# Patient Record
Sex: Male | Born: 1960 | Race: Black or African American | Hispanic: No | Marital: Single | Smoking: Current some day smoker
Health system: Southern US, Community
[De-identification: ages and names within clinical notes are randomized; demographics above are authoritative.]

## PROBLEM LIST (undated history)

## (undated) DIAGNOSIS — I10 Essential (primary) hypertension: Secondary | ICD-10-CM

## (undated) HISTORY — DX: Essential (primary) hypertension: I10

---

## 1998-08-29 ENCOUNTER — Emergency Department (HOSPITAL_COMMUNITY): Admission: EM | Admit: 1998-08-29 | Discharge: 1998-08-29 | Payer: Self-pay | Admitting: Emergency Medicine

## 1998-08-29 ENCOUNTER — Encounter: Payer: Self-pay | Admitting: Emergency Medicine

## 1999-10-10 ENCOUNTER — Encounter: Payer: Self-pay | Admitting: Emergency Medicine

## 1999-10-10 ENCOUNTER — Emergency Department (HOSPITAL_COMMUNITY): Admission: EM | Admit: 1999-10-10 | Discharge: 1999-10-10 | Payer: Self-pay | Admitting: Emergency Medicine

## 2002-08-17 ENCOUNTER — Emergency Department (HOSPITAL_COMMUNITY): Admission: EM | Admit: 2002-08-17 | Discharge: 2002-08-17 | Payer: Self-pay | Admitting: Emergency Medicine

## 2002-08-23 ENCOUNTER — Emergency Department (HOSPITAL_COMMUNITY): Admission: EM | Admit: 2002-08-23 | Discharge: 2002-08-23 | Payer: Self-pay | Admitting: Emergency Medicine

## 2003-01-17 ENCOUNTER — Emergency Department (HOSPITAL_COMMUNITY): Admission: AD | Admit: 2003-01-17 | Discharge: 2003-01-17 | Payer: Self-pay | Admitting: Family Medicine

## 2003-12-06 ENCOUNTER — Ambulatory Visit: Payer: Self-pay | Admitting: Family Medicine

## 2003-12-06 ENCOUNTER — Ambulatory Visit (HOSPITAL_COMMUNITY): Admission: RE | Admit: 2003-12-06 | Discharge: 2003-12-06 | Payer: Self-pay | Admitting: Internal Medicine

## 2005-07-10 ENCOUNTER — Emergency Department (HOSPITAL_COMMUNITY): Admission: EM | Admit: 2005-07-10 | Discharge: 2005-07-10 | Payer: Self-pay | Admitting: Emergency Medicine

## 2011-05-06 ENCOUNTER — Encounter (HOSPITAL_COMMUNITY): Payer: Self-pay | Admitting: Emergency Medicine

## 2011-05-06 ENCOUNTER — Emergency Department (HOSPITAL_COMMUNITY)
Admission: EM | Admit: 2011-05-06 | Discharge: 2011-05-06 | Disposition: A | Discharge status: Home / Self Care | Payer: Self-pay | Attending: Emergency Medicine | Admitting: Emergency Medicine

## 2011-05-06 ENCOUNTER — Other Ambulatory Visit: Payer: Self-pay

## 2011-05-06 DIAGNOSIS — K2970 Gastritis, unspecified, without bleeding: Secondary | ICD-10-CM

## 2011-05-06 DIAGNOSIS — K2990 Gastroduodenitis, unspecified, without bleeding: Secondary | ICD-10-CM | POA: Insufficient documentation

## 2011-05-06 DIAGNOSIS — R112 Nausea with vomiting, unspecified: Secondary | ICD-10-CM | POA: Insufficient documentation

## 2011-05-06 DIAGNOSIS — R1013 Epigastric pain: Secondary | ICD-10-CM | POA: Insufficient documentation

## 2011-05-06 LAB — LIPASE, BLOOD: Lipase: 27 U/L (ref 11–59)

## 2011-05-06 LAB — DIFFERENTIAL
Basophils Relative: 1 % (ref 0–1)
Eosinophils Absolute: 0.1 10*3/uL (ref 0.0–0.7)
Neutrophils Relative %: 48 % (ref 43–77)

## 2011-05-06 LAB — COMPREHENSIVE METABOLIC PANEL
ALT: 20 U/L (ref 0–53)
AST: 32 U/L (ref 0–37)
Albumin: 3.9 g/dL (ref 3.5–5.2)
Alkaline Phosphatase: 67 U/L (ref 39–117)
Potassium: 4.9 mEq/L (ref 3.5–5.1)
Sodium: 140 mEq/L (ref 135–145)
Total Protein: 7.9 g/dL (ref 6.0–8.3)

## 2011-05-06 LAB — CBC
MCH: 31.2 pg (ref 26.0–34.0)
MCHC: 36.5 g/dL — ABNORMAL HIGH (ref 30.0–36.0)
Platelets: 288 10*3/uL (ref 150–400)
RBC: 5.52 MIL/uL (ref 4.22–5.81)

## 2011-05-06 LAB — CARDIAC PANEL(CRET KIN+CKTOT+MB+TROPI)
Relative Index: 1 (ref 0.0–2.5)
Troponin I: 0.31 ng/mL (ref <0.30)

## 2011-05-06 MED ORDER — PROMETHAZINE HCL 25 MG PO TABS: 25.0000 mg | ORAL_TABLET | Freq: Four times a day (QID) | ORAL | Status: DC | PRN

## 2011-05-06 MED ORDER — GI COCKTAIL ~~LOC~~
30.0000 mL | Freq: Once | ORAL | Status: AC
  Administered 2011-05-06: 30 mL via ORAL
  Filled 2011-05-06: qty 30

## 2011-05-06 MED ORDER — SODIUM CHLORIDE 0.9 % IV BOLUS (SEPSIS)
500.0000 mL | Freq: Once | INTRAVENOUS | Status: AC
  Administered 2011-05-06: 1000 mL via INTRAVENOUS

## 2011-05-06 MED ORDER — PROMETHAZINE HCL 25 MG PO TABS: 25.0000 mg | ORAL_TABLET | Freq: Four times a day (QID) | ORAL | Status: AC | PRN

## 2011-05-06 MED ORDER — LANSOPRAZOLE 30 MG PO CPDR: 30.0000 mg | DELAYED_RELEASE_CAPSULE | Freq: Every day | ORAL | Status: DC

## 2011-05-06 MED ORDER — PANTOPRAZOLE SODIUM 40 MG IV SOLR
40.0000 mg | Freq: Once | INTRAVENOUS | Status: AC
  Administered 2011-05-06: 40 mg via INTRAVENOUS
  Filled 2011-05-06: qty 40

## 2011-05-06 NOTE — ED Notes (Signed)
Upper epigastric pain x 3-4 months does have nausea and some vomiting. states does drink occ having reg bms denies dysuria states lives at home to take care of his parents father is blind

## 2011-05-06 NOTE — ED Notes (Signed)
Pt reports for the last month every time he eats something he gets sick in the stomach and has been throwing up.  Pt states he is having epigastric pain.

## 2011-05-06 NOTE — ED Notes (Signed)
Went to talk to pt and he was on phone told me to come back

## 2011-05-06 NOTE — Discharge Instructions (Signed)
Gastritis °Gastritis is an inflammation (the body's way of reacting to injury and/or infection) of the stomach. It is often caused by viral or bacterial (germ) infections. It can also be caused by chemicals (including alcohol) and medications. This illness may be associated with generalized malaise (feeling tired, not well), cramps, and fever. The illness may last 2 to 7 days. If symptoms of gastritis continue, gastroscopy (looking into the stomach with a telescope-like instrument), biopsy (taking tissue samples), and/or blood tests may be necessary to determine the cause. Antibiotics will not affect the illness unless there is a bacterial infection present. One common bacterial cause of gastritis is an organism known as H. Pylori. This can be treated with antibiotics. Other forms of gastritis are caused by too much acid in the stomach. They can be treated with medications such as H2 blockers and antacids. Home treatment is usually all that is needed. Young children will quickly become dehydrated (loss of body fluids) if vomiting and diarrhea are both present. Medications may be given to control nausea. Medications are usually not given for diarrhea unless especially bothersome. Some medications slow the removal of the virus from the gastrointestinal tract. This slows down the healing process. °HOME CARE INSTRUCTIONS °Home care instructions for nausea and vomiting: °· For adults: drink small amounts of fluids often. Drink at least 2 quarts a day. Take sips frequently. Do not drink large amounts of fluid at one time. This may worsen the nausea.  °· Only take over-the-counter or prescription medicines for pain, discomfort, or fever as directed by your caregiver.  °· Drink clear liquids only. Those are anything you can see through such as water, broth, or soft drinks.  °· Once you are keeping clear liquids down, you may start full liquids, soups, juices, and ice cream or sherbet. Slowly add bland (plain, not spicy)  foods to your diet.  °Home care instructions for diarrhea: °· Diarrhea can be caused by bacterial infections or a virus. Your condition should improve with time, rest, fluids, and/or anti-diarrheal medication.  °· Until your diarrhea is under control, you should drink clear liquids often in small amounts. Clear liquids include: water, broth, jell-o water and weak tea.  °Avoid: °· Milk.  °· Fruits.  °· Tobacco.  °· Alcohol.  °· Extremely hot or cold fluids.  °· Too much intake of anything at one time.  °When your diarrhea stops you may add the following foods, which help the stool to become more formed: °· Rice.  °· Bananas.  °· Apples without skin.  °· Dry toast.  °Once these foods are tolerated you may add low-fat yogurt and low-fat cottage cheese. They will help to restore the normal bacterial balance in your bowel. °Wash your hands well to avoid spreading bacteria (germ) or virus. °SEEK IMMEDIATE MEDICAL CARE IF:  °· You are unable to keep fluids down.  °· Vomiting or diarrhea become persistent (constant).  °· Abdominal pain develops, increases, or localizes. (Right sided pain can be appendicitis. Left sided pain in adults can be diverticulitis.)  °· You develop a fever (an oral temperature above 102° F (38.9° C)).  °· Diarrhea becomes excessive or contains blood or mucus.  °· You have excessive weakness, dizziness, fainting or extreme thirst.  °· You are not improving or you are getting worse.  °· You have any other questions or concerns.  °Document Released: 03/02/2001 Document Revised: 11/18/2010 Document Reviewed: 03/08/2005 °[employer]® Patient Information ©2012 [employer]. °

## 2011-05-06 NOTE — ED Notes (Signed)
Pt alert and oriented at time of discharge. Denying any pain. NAD. Vitals stable. Review discharge instructions.

## 2011-05-06 NOTE — ED Provider Notes (Signed)
History     CSN: [id number]  Arrival date & time 05/06/11  0810   First MD Initiated Contact with Patient 05/06/11 [phone]      Chief Complaint  Patient presents with  . Abdominal Pain    (Consider location/radiation/quality/duration/timing/severity/associated sxs/prior treatment) Patient is a 51 y.o. male presenting with abdominal pain. The history is provided by the patient.  Abdominal Pain The primary symptoms of the illness include abdominal pain, nausea and vomiting. The primary symptoms of the illness do not include fever, shortness of breath, diarrhea or hematemesis. The current episode started more than 2 days ago.  The abdominal pain began more than 2 days ago. The abdominal pain has been unchanged since its onset. The abdominal pain is located in the epigastric region. The abdominal pain does not radiate. The abdominal pain is exacerbated by eating.  Nausea began more than 1 week ago. The nausea is associated with eating. The nausea is exacerbated by food.  The emesis contains stomach contents.  The illness is associated with eating. The patient has not had a change in bowel habit. Symptoms associated with the illness do not include chills, constipation, hematuria or back pain.   Pt states he has had > 2 months of epigastric pain worse after eating associated with nausea and occasional vomiting. No fever, chill, hematemesis, melena, chest pain, SOB. Pt states he is a regular drinker but denies anything but occasional NSAID use  No past medical history on file.  No past surgical history on file.  No family history on file.  History  Substance Use Topics  . Smoking status: Not on file  . Smokeless tobacco: Not on file  . Alcohol Use: Not on file      Review of Systems  Constitutional: Negative for fever and chills.  HENT: Negative for neck pain.   Respiratory: Negative for shortness of breath.   Cardiovascular: Negative for chest pain.  Gastrointestinal: Positive for  nausea, vomiting and abdominal pain. Negative for diarrhea, constipation, blood in stool, abdominal distention and hematemesis.  Genitourinary: Negative for hematuria and flank pain.  Musculoskeletal: Negative for back pain.  Skin: Negative for pallor and rash.  Neurological: Negative for dizziness, weakness, numbness and headaches.    Allergies  Review of patient's allergies indicates no known allergies.  Home Medications   Current Outpatient Rx  Name Route Sig Dispense Refill  . ASPIRIN EC 325 MG PO TBEC Oral Take 325 mg by mouth daily as needed. For headache      BP 114/71  Pulse 71  Temp(Src) 97 F (36.1 C) (Oral)  SpO2 96%  Physical Exam  Nursing note and vitals reviewed. Constitutional: He is oriented to person, place, and time. He appears well-developed and well-nourished. No distress.  HENT:  Head: Normocephalic and atraumatic.  Mouth/Throat: Oropharynx is clear and moist.  Eyes: EOM are normal. Pupils are equal, round, and reactive to light.  Neck: Normal range of motion. Neck supple.  Cardiovascular: Normal rate and regular rhythm.   Pulmonary/Chest: Effort normal and breath sounds normal. No respiratory distress. He has no wheezes. He has no rales.  Abdominal: Soft. Bowel sounds are normal. There is tenderness (mild tenderness over epigastrum). There is no rebound and no guarding.  Musculoskeletal: Normal range of motion. He exhibits no edema and no tenderness.  Neurological: He is alert and oriented to person, place, and time.  Skin: Skin is warm and dry. No rash noted. No erythema.  Psychiatric: He has a normal mood and affect.  His behavior is normal.    ED Course  Procedures (including critical care time)   Labs Reviewed  CBC  DIFFERENTIAL  COMPREHENSIVE METABOLIC PANEL  LIPASE, BLOOD  CARDIAC PANEL(CRET KIN+CKTOT+MB+TROPI)   No results found.   No diagnosis found.   Date: 05/06/2011  Rate: 65  Rhythm: normal sinus rhythm  QRS Axis: normal   Intervals: PR prolonged  ST/T Wave abnormalities: normal  Conduction Disutrbances:none  Narrative Interpretation:   Old EKG Reviewed: none available    MDM  Use NSAIDS with caution. Decrease EtOh consumption./ Return for concerns        Loren Racer, MD 05/06/11 1234

## 2011-05-06 NOTE — ED Notes (Signed)
Dr Ranae Palms aware of hemolized results of pt trop .31 order for redraw placed

## 2013-11-24 ENCOUNTER — Emergency Department (HOSPITAL_COMMUNITY): Payer: Self-pay

## 2013-11-24 ENCOUNTER — Emergency Department (HOSPITAL_COMMUNITY)
Admission: EM | Admit: 2013-11-24 | Discharge: 2013-11-24 | Disposition: A | Discharge status: Home / Self Care | Payer: Self-pay | Attending: Emergency Medicine | Admitting: Emergency Medicine

## 2013-11-24 ENCOUNTER — Encounter (HOSPITAL_COMMUNITY): Payer: Self-pay | Admitting: Emergency Medicine

## 2013-11-24 DIAGNOSIS — F17200 Nicotine dependence, unspecified, uncomplicated: Secondary | ICD-10-CM | POA: Insufficient documentation

## 2013-11-24 DIAGNOSIS — R079 Chest pain, unspecified: Secondary | ICD-10-CM | POA: Insufficient documentation

## 2013-11-24 DIAGNOSIS — R0789 Other chest pain: Secondary | ICD-10-CM | POA: Insufficient documentation

## 2013-11-24 DIAGNOSIS — R0602 Shortness of breath: Secondary | ICD-10-CM | POA: Insufficient documentation

## 2013-11-24 DIAGNOSIS — M545 Low back pain, unspecified: Secondary | ICD-10-CM | POA: Insufficient documentation

## 2013-11-24 LAB — I-STAT TROPONIN, ED: TROPONIN I, POC: 0 ng/mL (ref 0.00–0.08)

## 2013-11-24 LAB — CBC WITH DIFFERENTIAL/PLATELET
BASOS PCT: 0 % (ref 0–1)
Basophils Absolute: 0 10*3/uL (ref 0.0–0.1)
EOS ABS: 0 10*3/uL (ref 0.0–0.7)
Eosinophils Relative: 0 % (ref 0–5)
HCT: 45.1 % (ref 39.0–52.0)
HEMOGLOBIN: 16.4 g/dL (ref 13.0–17.0)
Lymphocytes Relative: 33 % (ref 12–46)
Lymphs Abs: 2.4 10*3/uL (ref 0.7–4.0)
MCH: 31.7 pg (ref 26.0–34.0)
MCHC: 36.4 g/dL — AB (ref 30.0–36.0)
MCV: 87.2 fL (ref 78.0–100.0)
Monocytes Absolute: 0.5 10*3/uL (ref 0.1–1.0)
Monocytes Relative: 6 % (ref 3–12)
NEUTROS ABS: 4.4 10*3/uL (ref 1.7–7.7)
NEUTROS PCT: 60 % (ref 43–77)
PLATELETS: 290 10*3/uL (ref 150–400)
RBC: 5.17 MIL/uL (ref 4.22–5.81)
RDW: 13.6 % (ref 11.5–15.5)
WBC: 7.4 10*3/uL (ref 4.0–10.5)

## 2013-11-24 LAB — APTT: APTT: 32 s (ref 24–37)

## 2013-11-24 LAB — BASIC METABOLIC PANEL
ANION GAP: 13 (ref 5–15)
BUN: 10 mg/dL (ref 6–23)
CALCIUM: 9 mg/dL (ref 8.4–10.5)
CHLORIDE: 103 meq/L (ref 96–112)
CO2: 25 meq/L (ref 19–32)
CREATININE: 1.01 mg/dL (ref 0.50–1.35)
GFR calc Af Amer: >90 mL/min (ref >90)
GFR calc non Af Amer: 83 mL/min — ABNORMAL LOW (ref >90)
GLUCOSE: 101 mg/dL — AB (ref 70–99)
Potassium: 3.6 mEq/L — ABNORMAL LOW (ref 3.7–5.3)
Sodium: 141 mEq/L (ref 137–147)

## 2013-11-24 LAB — PROTIME-INR
INR: 1.06 (ref 0.00–1.49)
PROTHROMBIN TIME: 13.8 s (ref 11.6–15.2)

## 2013-11-24 MED ORDER — IBUPROFEN 600 MG PO TABS: 600.0000 mg | ORAL_TABLET | Freq: Four times a day (QID) | ORAL | Status: AC | PRN

## 2013-11-24 NOTE — ED Notes (Signed)
Pt just returned from xray 

## 2013-11-24 NOTE — ED Provider Notes (Signed)
CSN: [id number]     Arrival date & time 11/24/13  5621 History   First MD Initiated Contact with Patient 11/24/13 0710     Chief Complaint  Patient presents with  . Chest Pain  . Back Pain     (Consider location/radiation/quality/duration/timing/severity/associated sxs/prior Treatment) HPI CP and low back pain. CP for 3-4 weeks, stabbing, few seconds. Twisting seems to bring on also at rest cones and goes. Occasionally SOB. Can do physical outside labor without CP or SOB.  Low back pain worse with walking, getting out of bed 3-4 years. No GU sx. Occasionally both legs feel numb. No car so walks everywhere.   FAM HX: father living 60 +CAD Mother living CVA at 53 7 sibs. All healthy.   History reviewed. No pertinent past medical history. History reviewed. No pertinent past surgical history. History reviewed. No pertinent family history. History  Substance Use Topics  . Smoking status: Current Every Day Smoker    Types: Cigarettes  . Smokeless tobacco: Not on file  . Alcohol Use: Yes     Comment: occ   EXCEPT AS PER HPI. Review of Systems  Constitutional: Negative for fever and chills.  HENT: Negative for congestion and sore throat.   Respiratory: Negative for cough and shortness of breath.   Cardiovascular: Negative for chest pain and leg swelling.  Gastrointestinal: Negative for vomiting, abdominal pain and diarrhea.  Genitourinary: Negative for dysuria and difficulty urinating.  Musculoskeletal: Negative for arthralgias and myalgias.  Skin: Negative for color change and rash.  Neurological: Negative for dizziness, weakness and headaches.  Psychiatric/Behavioral: Negative for confusion and agitation.      Allergies  Review of patient's allergies indicates no known allergies.  Home Medications   Prior to Admission medications   Not on File   BP 131/87  Pulse 95  Temp(Src) 97.4 F (36.3 C) (Oral)  Resp 18  Ht  (1.93 m)  Wt 190 lb (86.183 kg)  BMI 23.14  kg/m2  SpO2 97% Physical Exam  Constitutional: He is oriented to person, place, and time. He appears well-developed and well-nourished.  HENT:  Head: Normocephalic and atraumatic.  Eyes: EOM are normal. Pupils are equal, round, and reactive to light.  Neck: Neck supple.  Cardiovascular: Normal rate, regular rhythm, normal heart sounds and intact distal pulses.   Pulmonary/Chest: Effort normal and breath sounds normal. He exhibits tenderness.  Reproducible pain sternocostal margins.  Abdominal: Soft. Bowel sounds are normal. He exhibits no distension. There is no tenderness.  Musculoskeletal: Normal range of motion. He exhibits no edema.  Pain to palp central lower back and SI region. Pt identifies SI b/l as usual source of pain. Excellent lower extremity strength.  Neurological: He is alert and oriented to person, place, and time. He has normal strength. Coordination normal. GCS eye subscore is 4. GCS verbal subscore is 5. GCS motor subscore is 6.  Skin: Skin is warm, dry and intact.  Psychiatric: He has a normal mood and affect.    ED Course  Procedures (including critical care time) Labs Review Labs Reviewed - No data to display  Imaging Review No results found.   EKG Interpretation   Date/Time:  Saturday November 24 2013 07:01:17 EDT Ventricular Rate:  84 PR Interval:  196 QRS Duration: 106 QT Interval:  370 QTC Calculation: 437 R Axis:   -57 Text Interpretation:  Normal sinus rhythm Biatrial enlargement Left axis  deviation Pulmonary disease pattern No significant change since last  tracing Confirmed by HARRISON  MD, FORREST [phone]) on 11/24/2013 7:25:36 AM      MDM   Final diagnoses:  Chest pain with low risk for cardiac etiology  Bilateral low back pain without sciatica    This patient presents with chest pain is very atypical for cardiac disease. At this point in time EKG shows no changes the troponin is normal. I believe he is safe for continued outpatient  workup with risk factor modification of quitting smoking which as been discussed with the patient. The patient's back pain appears to be chronic in nature without any associated neurologic compromise. The patient is aware of the need for family physician ongoing medical care in the resources will be provided for the patient.    Arby Barrette, MD 11/24/13 [phone]

## 2013-11-24 NOTE — ED Notes (Signed)
Family at bedside. 

## 2013-11-24 NOTE — ED Notes (Signed)
Spoke with Dr. Donnald Garre. No further orders received for this patient at this time.

## 2013-11-24 NOTE — ED Notes (Signed)
Pt reports mid chest pain that started yesterday, increases with movement and also having lower back pain. No resp distress noted. Skin w/d. ekg done at triage.

## 2013-11-24 NOTE — Discharge Instructions (Signed)
°Emergency Department Resource Guide °1) Find a Doctor and Pay Out of Pocket °Although you won't have to find out who is covered by your insurance plan, it is a good idea to ask around and get recommendations. You will then need to call the office and see if the doctor you have chosen will accept you as a new patient and what types of options they offer for patients who are self-pay. Some doctors offer discounts or will set up payment plans for their patients who do not have insurance, but you will need to ask so you aren't surprised when you get to your appointment. ° °2) Contact Your Local Health Department °Not all health departments have doctors that can see patients for sick visits, but many do, so it is worth a call to see if yours does. If you don't know where your local health department is, you can check in your phone book. The CDC also has a tool to help you locate your state's health department, and many state websites also have listings of all of their local health departments. ° °3) Find a Walk-in Clinic °If your illness is not likely to be very severe or complicated, you may want to try a walk in clinic. These are popping up all over the country in pharmacies, drugstores, and shopping centers. They're usually staffed by nurse practitioners or physician assistants that have been trained to treat common illnesses and complaints. They're usually fairly quick and inexpensive. However, if you have serious medical issues or chronic medical problems, these are probably not your best option. ° °No Primary Care Doctor: °- Call [facility] at  832-8000 - they can help you locate a primary care doctor that  accepts your insurance, provides certain services, etc. °- Physician Referral Service- 1-800-533-3463 ° °Chronic Pain Problems: °Organization         Address  Phone   Notes  °[facility]  (336) 297-2271 Patients need to be referred by their primary care doctor.  ° °Medication  Assistance: °Organization         Address  Phone   Notes  °[place] [facility] [address] °[place], [place] [address] (336) 641-8030 --Must be a resident of [place] °-- Must have NO insurance coverage whatsoever (no Medicaid/ Medicare, etc.) °-- The pt. MUST have a primary care doctor that directs their care regularly and follows them in the community °  °[facility]  (866) 331-1348   °[employer]  (888) 892-1162   ° °Agencies that provide inexpensive medical care: °Organization         Address  Phone   Notes  °[facility]  (336) 832-8035   °[facility]    (336) 832-7272   °[facility] [address] °[place], [place] [address] (336) 832-4777   °[facility] [address], °[place] (336) 271-4999   °[facility]    (336) 373-0678   °[facility]    (336) 272-1050   °[facility] ° [address], [place] Phone:  (336) 832-4444, Fax:  (336) 832-4440 Hours of Operation:  9 am - 6 pm, M-F.  Also accepts Medicaid/Medicare and self-pay.  °[facility] ° [address], [place] Phone: (336) 832-3150, Fax: (336) [phone]. Hours of Operation:  8:30 am - 5:30 pm, M-F.  Also accepts Medicaid and self-pay.  °[facility] [place] [address], [place] Phone: (336) 878-6027   °[facility] [address], [place], [place] (336)723-1848, Ext. 123 Mondays & Thursdays: 7-9 AM.  First 15 patients are seen on a first come, first serve basis. °  ° °Medicaid-accepting [place] Providers: ° °Organization         Address  Phone   Notes  °[facility] [address], [place] (336) 641-2100 Also accepts self-pay patients.  °[facility] [address], [place] ° (336) 856-9996   °[facility] [address], [place]  (336) 288-8857   °[facility] [address] [place] [address], [place] (336) 299-7000   °Veita Bland [address], [place]  ° (336) 373-1557 Only accepts [place] Access Medicaid patients after they have their name applied to their card.  ° °Self-Pay (no insurance) in [place]: ° °Organization         Address  Phone   Notes  °Sickle Cell Patients, [facility] [address], [place] (336) 832-1970   °[facility] [address], [place] (336) 832-4400   °[facility] ° [address], [place] (336) 992-4800   °[facility]/Dr. Osei-Bonsu ° [address] [place] [address], [place] or [address], [place] (336) 841-8500 Phone number for both [place] and [place] locations is the same.  °[facility] [address], [place] (336) 299-0000   °[facility] [address] [place] [address], [place] or [address] (336) 852-7530 °(336) 878-2260   °[facility] [address], [place] (336) 350-1642, phone; (336) 294-5005, fax Sees patients 1st and 3rd Saturday of every month.  Must not qualify for public or private insurance (i.e. Medicaid, Medicare, [place] Health Choice, Veterans' Benefits) • Household income should be no more than 200% of the poverty level •The clinic cannot treat you if you are pregnant or think you are pregnant • Sexually transmitted diseases are not treated at the clinic.  ° ° °Dental Care: °Organization         Address  Phone  Notes  °[place] [facility] [address], [place] (336) 641-6152 Accepts children up to age 21 who are enrolled in Medicaid or [place] Health Choice; pregnant women with a Medicaid card; and children who have applied for Medicaid or [place] Health Choice, but were declined, whose parents can pay a reduced fee at time of service.  °[place]  [facility] [place]  [address], [place] (336) 641-7733 Accepts children up to age 21 who are enrolled in Medicaid or [place] Health Choice; pregnant women with a Medicaid card; and children who have applied for Medicaid or [place] Health Choice, but were declined, whose parents can pay a reduced fee at time of service.  °[facility] ° [address], [place] (336) 641-4533 Patients are seen by appointment only. Walk-ins are not accepted. [facility] will see patients 18 years of age and older. °Monday - Tuesday (8am-5pm) °Most Wednesdays (8:30-5pm) °$30 per visit, cash only  °[facility] ° [address], [place] (336) 641-4533 Patients are seen by appointment only. Walk-ins are not accepted. [facility] will see patients 18 years of age and older. °One   Wednesday Evening (Monthly: Volunteer Based).  $30 per visit, cash only  °[facility]  (919) 537-3737 for adults; Children under age 4, call Graduate Pediatric Dentistry at ([phone]) [phone]. Children aged 4-14, please call ([phone]) [phone] to request a pediatric application. ° Dental services are provided in all areas of dental care including fillings, crowns and bridges, complete and partial dentures, implants, gum treatment, root canals, and extractions. Preventive care is also provided. Treatment is provided to both adults and children. °Patients are selected via a lottery and there is often a waiting list. °  °[facility] [address], °[place] ° (336) 763-8833 [email or web address] °  °[facility] [address], [place], [place] (336)723-1848, Ext. 123 Second and Fourth Thursday of each month, opens at 6:30 AM; Clinic ends at 9 AM.  Patients are seen on a first-come first-served basis, and a limited number are seen during each clinic.  ° °[facility] ° [address], [place], [place] (336) 723-7904    Eligibility Requirements °You must have lived in [place], [place], or [place] counties for at least the last three months. °  You cannot be eligible for state or federal sponsored healthcare insurance, including Veterans Administration, Medicaid, or Medicare. °  You generally cannot be eligible for healthcare insurance through your employer.  °  How to apply: °Eligibility screenings are held every Tuesday and Wednesday afternoon from 1:00 pm until 4:00 pm. You do not need an appointment for the interview!  °[facility] [address], [place], [place] 336-631-2330   °[facility]  336-342-8273   °[facility]  336-703-3100   °[facility]  336-570-6415   ° °Behavioral Health Resources in the Community: °Intensive Outpatient Programs °Organization         Address  Phone  Notes  °[facility] [address], [place], [place] 336-878-6098   °[facility] [address], [place], [place] 336-832-9800   °[facility] [address], [place], [place] ° 336-882-2125   °[facility] [address],  °[place], [place] 1-800-853-5163 or 336-641-4981   °Substance Abuse Resources °Organization         Address  Phone  Notes  °[facility]  336-882-2125   °[facility]  336-784-9470   °[facility]  336-285-9073   °[facility]  336-845-3988   °Residential & Outpatient Substance Abuse Program  1-800-659-3381   °Psychological Services °Organization         Address  Phone  Notes  °[facility]  336- 832-9600   °[facility]  336- 378-7881   °[facility] [address], [place] 1-800-853-5163 or 336-641-4981   ° °Mobile Crisis Teams °Organization         Address  Phone  Notes  °[facility], Mobile Crisis Care Unit  1-877-626-1772   °Assertive °[facility] ° [address]  [place], [place] 336-834-9664   °Sharon DeEsch [address] °[place] 336-554-5454   ° °Self-Help/Support Groups °Organization         Address  Phone             Notes  °[facility]  - variety of support groups  336- 373-1402 Call for more information  °Narcotics Anonymous (NA), [facility] [address], °[place]  2 meetings at this location  ° °  Residential Treatment Programs Organization         Address  Phone  Notes  [facility] [address]    [place]  1-478-295-6213   [facility]  [address] [place] [address], [place], [place] 469-629-5284   [facility] [address] [place], [place] 132-440-1027 Admissions: 8am-3pm M-F  [facility] [address],    [place], [place] 253-664-4034   [facility] [address] [place], [place], [place] 742-595-6387   [facility] [address],  [place], [place] 564-332-9518   [facility] Intensive Outpatient [address], [place], [place] 841-660-6301   [facility] ([facility] [address] [place]  [place], [place] 6-010-932-3557 or (724) 306-6363   [facility] ([facility] [address] [place], [place] 623-762-8315 Accepts Medicaid  [facility] [address],  [place] 1-761-607-3710 Substance Abuse/Addiction Treatment   [facility]         Address  Phone  Notes  [facility]  (949) 216-8729   Angie Fava, PhD [address] [place], [place]   (717)265-2478 or 506-118-9413   [facility]   [address] [place], [place] [phone]   [facility] [address] [place], [place] [phone] Insurance/Medicaid/sponsorship through [facility] [address]                                    [place], [place] [phone] Therapy/tele-psych/case    [facility] [address], [place] 250-627-2650    Dr. Lolly Mustache  [phone]   [facility]  [employer] [facility] Dept. 1) [address], [place] 2) [address] [place] 3)  [address], [place] [phone]  339-517-5356   [facility] Child Abuse Hotline 205-332-7118 or (438)126-0054 (After Hours)     Chest Pain (Nonspecific) It is often hard to give a specific diagnosis for the cause of chest pain. There is always a chance that your pain could be related to something serious, such as a heart attack or a blood clot in the lungs. You need to follow up with your health care provider for further evaluation. CAUSES   Heartburn.  Pneumonia or bronchitis.  Anxiety or stress.  Inflammation around your heart (pericarditis) or lung (pleuritis or pleurisy).  A blood clot in the lung.  A collapsed lung (pneumothorax). It can develop suddenly on its own (spontaneous pneumothorax) or from trauma to the chest.  Shingles infection (herpes zoster virus). The chest wall is composed of bones, muscles, and cartilage. Any of these can be the source of the pain.  The bones can be bruised by injury.  The muscles or cartilage can be strained by coughing or overwork.  The cartilage can be affected by inflammation and become sore (costochondritis). DIAGNOSIS  Lab tests or other studies may be needed to find the cause of your pain. Your health care provider may have you take a test called an ambulatory electrocardiogram (ECG). An ECG records your heartbeat patterns over a 24-hour period. You may also have other tests, such as:  Transthoracic echocardiogram (TTE). During echocardiography, sound waves are used to evaluate how blood flows through your heart.  Transesophageal echocardiogram (TEE).  Cardiac monitoring. This allows your health care provider to monitor your heart rate and rhythm in real  time.  Holter monitor. This is a portable device that records your heartbeat and can help diagnose heart arrhythmias. It allows your health care provider to track your heart activity for several days, if needed.  Stress tests by exercise or by giving medicine that makes the heart beat faster. TREATMENT   Treatment depends on what may be causing your chest pain. Treatment may include:  Acid blockers for heartburn.  Anti-inflammatory medicine.  Pain medicine for inflammatory conditions.  Antibiotics if an infection is present.  You may be advised to change lifestyle habits. This includes stopping smoking and avoiding alcohol, caffeine, and chocolate.  You may be advised to keep your head raised (elevated) when sleeping. This reduces the chance of acid going backward from your stomach into your esophagus. Most of the time, nonspecific chest pain will improve within 2-3 days with rest and mild pain medicine.  HOME CARE INSTRUCTIONS   If antibiotics were prescribed, take them as directed. Finish them even if you start to feel better.  For the next few days, avoid physical activities that bring on chest pain. Continue physical activities as directed.  Do not use any tobacco products, including cigarettes, chewing tobacco, or electronic cigarettes.  Avoid drinking alcohol.  Only take medicine as directed by your health care provider.  Follow your health care provider's suggestions for further testing if your chest pain does not go away.  Keep any follow-up appointments you made. If you do not go to an appointment, you could develop lasting (chronic) problems with pain. If there is any problem keeping an appointment, call to reschedule. SEEK MEDICAL CARE IF:   Your chest pain does not go away, even after treatment.  You have a rash with blisters on your chest.  You have a fever. SEEK IMMEDIATE MEDICAL CARE IF:   You have increased chest pain or pain that spreads to your arm,  neck, jaw, back, or abdomen.  You have shortness of breath.  You have an increasing cough, or you cough up blood.  You have severe back or abdominal pain.  You feel nauseous or vomit.  You have severe weakness.  You faint.  You have chills. This is an emergency. Do not wait to see if the pain will go away. Get medical help at once. Call your local emergency services (911 in [place] Do not drive yourself to the hospital. MAKE SURE YOU:   Understand these instructions.  Will watch your condition.  Will get help right away if you are not doing well or get worse. Document Released: 12/16/2004 Document Revised: 03/13/2013 Document Reviewed: 10/12/2007 [facility] Patient Information 2015 [place], [place]. This information is not intended to replace advice given to you by your health care provider. Make sure you discuss any questions you have with your health care provider. Back Pain, Adult Low back pain is very common. About 1 in 5 people have back pain.The cause of low back pain is rarely dangerous. The pain often gets better over time.About half of people with a sudden onset of back pain feel better in just 2 weeks. About 8 in 10 people feel better by 6 weeks.  CAUSES Some common causes of back pain include:  Strain of the muscles or ligaments supporting the spine.  Wear and tear (degeneration) of the spinal discs.  Arthritis.  Direct injury to the back. DIAGNOSIS Most of the  time, the direct cause of low back pain is not known.However, back pain can be treated effectively even when the exact cause of the pain is unknown.Answering your caregiver's questions about your overall health and symptoms is one of the most accurate ways to make sure the cause of your pain is not dangerous. If your caregiver needs more information, he or she may order lab work or imaging tests (X-rays or MRIs).However, even if imaging tests show changes in your back, this usually does not require  surgery. HOME CARE INSTRUCTIONS For many people, back pain returns.Since low back pain is rarely dangerous, it is often a condition that people can learn to [facility] their own.   Remain active. It is stressful on the back to sit or stand in one place. Do not sit, drive, or stand in one place for more than 30 minutes at a time. Take short walks on level surfaces as soon as pain allows.Try to increase the length of time you walk each day.  Do not stay in bed.Resting more than 1 or 2 days can delay your recovery.  Do not avoid exercise or work.Your body is made to move.It is not dangerous to be active, even though your back may hurt.Your back will likely heal faster if you return to being active before your pain is gone.  Pay attention to your body when you bend and lift. Many people have less discomfortwhen lifting if they bend their knees, keep the load close to their bodies,and avoid twisting. Often, the most comfortable positions are those that put less stress on your recovering back.  Find a comfortable position to sleep. Use a firm mattress and lie on your side with your knees slightly bent. If you lie on your back, put a pillow under your knees.  Only take over-the-counter or prescription medicines as directed by your caregiver. Over-the-counter medicines to reduce pain and inflammation are often the most helpful.Your caregiver may prescribe muscle relaxant drugs.These medicines help dull your pain so you can more quickly return to your normal activities and healthy exercise.  Put ice on the injured area.  Put ice in a plastic bag.  Place a towel between your skin and the bag.  Leave the ice on for 15-20 minutes, 03-04 times a day for the first 2 to 3 days. After that, ice and heat may be alternated to reduce pain and spasms.  Ask your caregiver about trying back exercises and gentle massage. This may be of some benefit.  Avoid feeling anxious or stressed.Stress increases  muscle tension and can worsen back pain.It is important to recognize when you are anxious or stressed and learn ways to manage it.Exercise is a great option. SEEK MEDICAL CARE IF:  You have pain that is not relieved with rest or medicine.  You have pain that does not improve in 1 week.  You have new symptoms.  You are generally not feeling well. SEEK IMMEDIATE MEDICAL CARE IF:   You have pain that radiates from your back into your legs.  You develop new bowel or bladder control problems.  You have unusual weakness or numbness in your arms or legs.  You develop nausea or vomiting.  You develop abdominal pain.  You feel faint. Document Released: 03/08/2005 Document Revised: 09/07/2011 Document Reviewed: 07/10/2013 [facility] Patient Information 2015 [place], [place]. This information is not intended to replace advice given to you by your health care provider. Make sure you discuss any questions you have with your health care provider.

## 2020-07-27 ENCOUNTER — Other Ambulatory Visit: Payer: Self-pay

## 2020-07-27 ENCOUNTER — Encounter (HOSPITAL_COMMUNITY): Payer: Self-pay | Admitting: Emergency Medicine

## 2020-07-27 ENCOUNTER — Emergency Department (HOSPITAL_COMMUNITY)
Admission: EM | Admit: 2020-07-27 | Discharge: 2020-07-28 | Disposition: A | Discharge status: Home / Self Care | Payer: Self-pay | Attending: Emergency Medicine | Admitting: Emergency Medicine

## 2020-07-27 DIAGNOSIS — I10 Essential (primary) hypertension: Secondary | ICD-10-CM | POA: Insufficient documentation

## 2020-07-27 DIAGNOSIS — F17210 Nicotine dependence, cigarettes, uncomplicated: Secondary | ICD-10-CM | POA: Insufficient documentation

## 2020-07-27 LAB — CBC WITH DIFFERENTIAL/PLATELET
Abs Immature Granulocytes: 0.02 10*3/uL (ref 0.00–0.07)
Basophils Absolute: 0.1 10*3/uL (ref 0.0–0.1)
Basophils Relative: 1 %
Eosinophils Absolute: 0.1 10*3/uL (ref 0.0–0.5)
Eosinophils Relative: 2 %
HCT: 49.5 % (ref 39.0–52.0)
Hemoglobin: 17.4 g/dL — ABNORMAL HIGH (ref 13.0–17.0)
Immature Granulocytes: 0 %
Lymphocytes Relative: 51 %
Lymphs Abs: 3.7 10*3/uL (ref 0.7–4.0)
MCH: 30.5 pg (ref 26.0–34.0)
MCHC: 35.2 g/dL (ref 30.0–36.0)
MCV: 86.7 fL (ref 80.0–100.0)
Monocytes Absolute: 0.7 10*3/uL (ref 0.1–1.0)
Monocytes Relative: 9 %
Neutro Abs: 2.7 10*3/uL (ref 1.7–7.7)
Neutrophils Relative %: 37 %
Platelets: 306 10*3/uL (ref 150–400)
RBC: 5.71 MIL/uL (ref 4.22–5.81)
RDW: 14.2 % (ref 11.5–15.5)
WBC: 7.3 10*3/uL (ref 4.0–10.5)
nRBC: 0 % (ref 0.0–0.2)

## 2020-07-27 LAB — BASIC METABOLIC PANEL
Anion gap: 6 (ref 5–15)
BUN: 21 mg/dL — ABNORMAL HIGH (ref 6–20)
CO2: 28 mmol/L (ref 22–32)
Calcium: 9.2 mg/dL (ref 8.9–10.3)
Chloride: 101 mmol/L (ref 98–111)
Creatinine, Ser: 1.25 mg/dL — ABNORMAL HIGH (ref 0.61–1.24)
GFR, Estimated: >60 mL/min (ref >60)
Glucose, Bld: 109 mg/dL — ABNORMAL HIGH (ref 70–99)
Potassium: 4 mmol/L (ref 3.5–5.1)
Sodium: 135 mmol/L (ref 135–145)

## 2020-07-27 NOTE — ED Triage Notes (Signed)
Patient reports elevated blood pressure at home this morning 171/98 , occsional dizziness, no SOB or pain .

## 2020-07-27 NOTE — ED Provider Notes (Signed)
Emergency Medicine Provider Triage Evaluation Note  Jon Scott , a 60 y.o. male  was evaluated in triage.  Pt complains of dizziness with changing positions for x 9 days. He feels like he is drunk although has not been drinking. He thinks his blood pressure is running high. 171/98 when he checked it at home. Never been diagnosed with hypertension although does not see doctor regularly.  Review of Systems  Positive: Blurry vision, dizziness Negative: Chest, headache, fall, head injury  Physical Exam  BP (!) 146/81 (BP Location: Right Arm)   Pulse 71   Temp 98.1 F (36.7 C) (Oral)   Resp 16   SpO2 98%  Gen:   Awake, no distress   Resp:  Normal effort  MSK:   Moves extremities without difficulty  Other:  Strong and equal grip strength in all extremities. Speech clear. No facial droop  Medical Decision Making  Medically screening exam initiated at 9:53 PM.  Appropriate orders placed.  Joshoa Shawler was informed that the remainder of the evaluation will be completed by another provider, this initial triage assessment does not replace that evaluation, and the importance of remaining in the ED until their evaluation is complete.  Labs and EKG ordered.   Shanon Ace, PA-C 07/27/20 2156    Tilden Fossa, MD 07/27/20 2342

## 2020-07-28 ENCOUNTER — Emergency Department (HOSPITAL_COMMUNITY): Payer: Self-pay

## 2020-07-28 MED ORDER — HYDROCHLOROTHIAZIDE 25 MG PO TABS: 25.0000 mg | ORAL_TABLET | Freq: Every day | ORAL | 6 refills | Status: AC

## 2020-07-28 NOTE — ED Notes (Signed)
Pt states his bp ws elevated this morning and he felt a little uneasy on his feet. Pt denies any other acute medical problems.

## 2020-07-28 NOTE — Discharge Instructions (Signed)
Start taking the prescribed blood pressure medication.  You need to take in some extra potassium with this medication, eating a banana daily can be helpful.  Please find a primary care doctor to follow-up with for a recheck of your blood pressure.

## 2020-07-28 NOTE — ED Provider Notes (Signed)
[facility] EMERGENCY DEPARTMENT Provider Note   CSN: [id number] Arrival date & time: 07/27/20  2011     History Chief Complaint  Patient presents with  . Hypertension    Jon Scott is a 60 y.o. male.  Patient presents to the emergency department for evaluation of elevated blood pressure.  Patient reports that for the last month or so he has noticed that he is dizzy and unsteady on his feet when he wakes up.  He has checked his blood pressure a couple times in the last 2 weeks and it has been persistently elevated.  He does not have a previous history of high blood pressure.  No associated chest pain, heart palpitations, shortness of breath.  No unilateral neurologic symptoms.  No trouble with speech.        History reviewed. No pertinent past medical history.  There are no problems to display for this patient.   History reviewed. No pertinent surgical history.     No family history on file.  Social History   Tobacco Use  . Smoking status: Current Every Day Smoker    Types: Cigarettes  . Smokeless tobacco: Never Used  Substance Use Topics  . Alcohol use: Yes    Comment: occ  . Drug use: Yes    Types: Marijuana, Cocaine    Home Medications Prior to Admission medications   Medication Sig Start Date End Date Taking? Authorizing Provider  hydrochlorothiazide (HYDRODIURIL) 25 MG tablet Take 1 tablet (25 mg total) by mouth daily. 07/28/20  Yes Merrissa Giacobbe, Canary Brim, MD  ibuprofen (ADVIL,MOTRIN) 600 MG tablet Take 1 tablet (600 mg total) by mouth every 6 (six) hours as needed. 11/24/13   Arby Barrette, MD    Allergies    Patient has no known allergies.  Review of Systems   Review of Systems  Neurological: Positive for dizziness.  All other systems reviewed and are negative.   Physical Exam Updated Vital Signs BP (!) 140/91   Pulse 66   Temp 98.6 F (37 C) (Oral)   Resp 15   SpO2 99%   Physical Exam Vitals and nursing note reviewed.   Constitutional:      General: He is not in acute distress.    Appearance: Normal appearance. He is well-developed.  HENT:     Head: Normocephalic and atraumatic.     Right Ear: Hearing normal.     Left Ear: Hearing normal.     Nose: Nose normal.  Eyes:     Conjunctiva/sclera: Conjunctivae normal.     Pupils: Pupils are equal, round, and reactive to light.  Cardiovascular:     Rate and Rhythm: Regular rhythm.     Heart sounds: S1 normal and S2 normal. No murmur heard. No friction rub. No gallop.   Pulmonary:     Effort: Pulmonary effort is normal. No respiratory distress.     Breath sounds: Normal breath sounds.  Chest:     Chest wall: No tenderness.  Abdominal:     General: Bowel sounds are normal.     Palpations: Abdomen is soft.     Tenderness: There is no abdominal tenderness. There is no guarding or rebound. Negative signs include Murphy's sign and McBurney's sign.     Hernia: No hernia is present.  Musculoskeletal:        General: Normal range of motion.     Cervical back: Normal range of motion and neck supple.  Skin:    General: Skin is warm and  dry.     Findings: No rash.  Neurological:     Mental Status: He is alert and oriented to person, place, and time.     GCS: GCS eye subscore is 4. GCS verbal subscore is 5. GCS motor subscore is 6.     Cranial Nerves: No cranial nerve deficit.     Sensory: No sensory deficit.     Coordination: Coordination normal.  Psychiatric:        Speech: Speech normal.        Behavior: Behavior normal.        Thought Content: Thought content normal.     ED Results / Procedures / Treatments   Labs (all labs ordered are listed, but only abnormal results are displayed) Labs Reviewed  BASIC METABOLIC PANEL - Abnormal; Notable for the following components:      Result Value   Glucose, Bld 109 (*)    BUN 21 (*)    Creatinine, Ser 1.25 (*)    All other components within normal limits  CBC WITH DIFFERENTIAL/PLATELET - Abnormal;  Notable for the following components:   Hemoglobin 17.4 (*)    All other components within normal limits    EKG EKG Interpretation  Date/Time:  Sunday Jul 27 2020 22:13:36 EDT Ventricular Rate:  74 PR Interval:  198 QRS Duration: 80 QT Interval:  372 QTC Calculation: 412 R Axis:   -63 Text Interpretation: Normal sinus rhythm Pulmonary disease pattern Left anterior fascicular block Abnormal ECG Confirmed by Gilda Crease [phone]) on 07/28/2020 12:20:55 AM   Radiology CT HEAD WO CONTRAST  Result Date: 07/28/2020 CLINICAL DATA:  Dizziness EXAM: CT HEAD WITHOUT CONTRAST TECHNIQUE: Contiguous axial images were obtained from the base of the skull through the vertex without intravenous contrast. COMPARISON:  None. FINDINGS: Brain: There is no mass, hemorrhage or extra-axial collection. The size and configuration of the ventricles and extra-axial CSF spaces are normal. The brain parenchyma is normal, without acute or chronic infarction. Vascular: No abnormal hyperdensity of the major intracranial arteries or dural venous sinuses. No intracranial atherosclerosis. Skull: The visualized skull base, calvarium and extracranial soft tissues are normal. Sinuses/Orbits: No fluid levels or advanced mucosal thickening of the visualized paranasal sinuses. No mastoid or middle ear effusion. The orbits are normal. IMPRESSION: Normal head CT. Electronically Signed   By: Deatra Robinson M.D.   On: 07/28/2020 01:49    Procedures Procedures   Medications Ordered in ED Medications - No data to display  ED Course  I have reviewed the triage vital signs and the nursing notes.  Pertinent labs & imaging results that were available during my care of the patient were reviewed by me and considered in my medical decision making (see chart for details).    MDM Rules/Calculators/A&P                          Patient presents to the emergency department for evaluation of elevated blood pressure.  He reports that  he has been noticing that he feels off balance when he changes positions.  This is been ongoing for weeks.  After he is up and moving around for a while the symptoms resolved.  He has a normal, nonfocal neurologic exam.  Patient did check his blood pressure several times in the last couple of weeks because of the symptoms and it has been persistently elevated.  He does not have a history of high blood pressure but reports that all of  his family members do.  He is not experiencing associated chest pain, palpitations or shortness of breath.  Head CT unremarkable.  Symptoms likely secondary to high blood pressure, possible some element of peripheral vertigo.  Will initiate low-dose hydrochlorothiazide and have him establish primary care for follow-up for recheck.  No further work-up necessary tonight.  Final Clinical Impression(s) / ED Diagnoses Final diagnoses:  Primary hypertension    Rx / DC Orders ED Discharge Orders         Ordered    hydrochlorothiazide (HYDRODIURIL) 25 MG tablet  Daily        07/28/20 0158           Gilda Crease, MD 07/28/20 [phone]

## 2021-05-01 ENCOUNTER — Emergency Department (HOSPITAL_COMMUNITY): Payer: Self-pay

## 2021-05-01 ENCOUNTER — Emergency Department (HOSPITAL_COMMUNITY)
Admission: EM | Admit: 2021-05-01 | Discharge: 2021-05-01 | Disposition: A | Discharge status: Home / Self Care | Payer: Self-pay | Attending: Emergency Medicine | Admitting: Emergency Medicine

## 2021-05-01 DIAGNOSIS — G8929 Other chronic pain: Secondary | ICD-10-CM

## 2021-05-01 DIAGNOSIS — R519 Headache, unspecified: Secondary | ICD-10-CM

## 2021-05-01 DIAGNOSIS — G44209 Tension-type headache, unspecified, not intractable: Secondary | ICD-10-CM | POA: Insufficient documentation

## 2021-05-01 DIAGNOSIS — Z79899 Other long term (current) drug therapy: Secondary | ICD-10-CM | POA: Insufficient documentation

## 2021-05-01 MED ORDER — KETOROLAC TROMETHAMINE 15 MG/ML IJ SOLN
15.0000 mg | Freq: Once | INTRAMUSCULAR | Status: AC
  Administered 2021-05-01: 15 mg via INTRAMUSCULAR
  Filled 2021-05-01: qty 1

## 2021-05-01 MED ORDER — ACETAMINOPHEN 500 MG PO TABS
1000.0000 mg | ORAL_TABLET | Freq: Once | ORAL | Status: AC
  Administered 2021-05-01: 1000 mg via ORAL
  Filled 2021-05-01: qty 2

## 2021-05-01 NOTE — ED Notes (Signed)
Reviewed discharge instructions with patient. Follow-up care reviewed. Patient verbalized understanding. Patient A&Ox4, VSS, and ambulatory with steady gait upon discharge.  

## 2021-05-01 NOTE — ED Provider Triage Note (Signed)
Emergency Medicine Provider Triage Evaluation Note  Jon Scott , a 61 y.o. male  was evaluated in triage.  Pt complains of intermittent headache for the past 2 weeks.  Cannot recall any incident that may have triggered his headaches.  No specific aggravating or alleviating factors.  Has not taken any medicine to help with his headache.  Denies any blurry vision, numbness, weakness, vomiting, sick contacts or similar symptoms or fever.  Review of Systems  Positive: Headache Negative: See above  Physical Exam  BP 126/84 (BP Location: Left Arm)    Pulse 72    Temp 97.6 F (36.4 C) (Oral)    Resp 14    SpO2 98%  Gen:   Awake, no distress   Resp:  Normal effort  MSK:   Moves extremities without difficulty  Other:  Strength 5/5 in bilateral upper and lower extremities.  No facial asymmetry.  Normal speech no aphasia  Medical Decision Making  Medically screening exam initiated at 10:09 AM.  Appropriate orders placed.  Jon Scott was informed that the remainder of the evaluation will be completed by another provider, this initial triage assessment does not replace that evaluation, and the importance of remaining in the ED until their evaluation is complete.  CT head ordered due to new onset headache and age   Jon Scott, [place] 05/01/21 1009

## 2021-05-01 NOTE — ED Provider Notes (Addendum)
[facility] EMERGENCY DEPARTMENT Provider Note   CSN: [id number] Arrival date & time: 05/01/21  3295     History  Chief Complaint  Patient presents with   Headache    Jon Scott is a 61 y.o. male.  61 year old male with prior medical history as presents for evaluation of headache.  Patient recalls persistent headache for the last 2 weeks.  Patient's headache is not the worst of his life.  He denies any associated visual change, nausea, vomiting, weakness, or other complaint.  Patient does admit that he occasionally uses cocaine.  He denies any recent use of cocaine.  He is comfortable and in no particular distress during exam.  The history is provided by the patient and medical records.  Headache Pain location:  Generalized Quality:  Unable to specify Radiates to:  Does not radiate Severity currently:  Unable to specify Severity at highest:  Unable to specify Onset quality:  Unable to specify Duration:  2 weeks Timing:  Intermittent Progression:  Waxing and waning     Home Medications Prior to Admission medications   Medication Sig Start Date End Date Taking? Authorizing Provider  hydrochlorothiazide (HYDRODIURIL) 25 MG tablet Take 1 tablet (25 mg total) by mouth daily. 07/28/20   Gilda Crease, MD  ibuprofen (ADVIL,MOTRIN) 600 MG tablet Take 1 tablet (600 mg total) by mouth every 6 (six) hours as needed. 11/24/13   Arby Barrette, MD      Allergies    Patient has no known allergies.    Review of Systems   Review of Systems  Neurological:  Positive for headaches.  All other systems reviewed and are negative.  Physical Exam Updated Vital Signs BP 122/75 (BP Location: Left Arm)    Pulse 64    Temp 98.2 F (36.8 C) (Oral)    Resp 14    SpO2 96%  Physical Exam Vitals and nursing note reviewed.  Constitutional:      General: He is not in acute distress.    Appearance: Normal appearance. He is well-developed.  HENT:     Head:  Normocephalic and atraumatic.  Eyes:     Conjunctiva/sclera: Conjunctivae normal.     Pupils: Pupils are equal, round, and reactive to light.  Cardiovascular:     Rate and Rhythm: Normal rate and regular rhythm.     Heart sounds: Normal heart sounds.  Pulmonary:     Effort: Pulmonary effort is normal. No respiratory distress.     Breath sounds: Normal breath sounds.  Abdominal:     General: There is no distension.     Palpations: Abdomen is soft.     Tenderness: There is no abdominal tenderness.  Musculoskeletal:        General: No deformity. Normal range of motion.     Cervical back: Normal range of motion and neck supple.  Skin:    General: Skin is warm and dry.  Neurological:     General: No focal deficit present.     Mental Status: He is alert and oriented to person, place, and time.    ED Results / Procedures / Treatments   Labs (all labs ordered are listed, but only abnormal results are displayed) Labs Reviewed - No data to display  EKG None  Radiology CT HEAD WO CONTRAST ( )  Result Date: 05/01/2021 CLINICAL DATA:  Headache new or worsening EXAM: CT HEAD WITHOUT CONTRAST TECHNIQUE: Contiguous axial images were obtained from the base of the skull through the vertex without intravenous  contrast. RADIATION DOSE REDUCTION: This exam was performed according to the departmental dose-optimization program which includes automated exposure control, adjustment of the mA and/or kV according to patient size and/or use of iterative reconstruction technique. COMPARISON:  Jul 28, 2020 FINDINGS: Brain: No evidence of acute infarction, hemorrhage, hydrocephalus, extra-axial collection or mass lesion/mass effect. Vascular: No hyperdense vessel or unexpected calcification. Skull: Normal. Negative for fracture or focal lesion. Sinuses/Orbits: Partially visualized mucous retention cyst in the right maxillary sinus. Orbits are unremarkable. Other: Visualized portions of the mastoid air cells are  predominantly clear. IMPRESSION: No acute intracranial abnormality. Electronically Signed   By: Maudry Mayhew M.D.   On: 05/01/2021 11:01    Procedures Procedures    Medications Ordered in ED Medications  acetaminophen (TYLENOL) tablet 1,000 mg (has no administration in time range)  ketorolac (TORADOL) 15 MG/ML injection 15 mg (has no administration in time range)    ED Course/ Medical Decision Making/ A&P                           Medical Decision Making Risk OTC drugs. Prescription drug management.    Medical Screen Complete  This patient presented to the ED with complaint of headache.  This complaint involves an extensive number of treatment options. The initial differential diagnosis includes, but is not limited to, headache, intracranial pathology  This presentation is: Chronic, Self-Limited, Previously Undiagnosed, Uncertain Prognosis, and Complicated  Patient presents with complaint of chronic headache.  Patient's exam is without suggestion of significant acute pathology.  CT imaging of the brain is without acute pathology.  Patient is agreeable to trial of Toradol and Tylenol for treatment of his symptoms today.  Patient without indication for additional emergent evaluation or work-up.  Patient does understand need for close outpatient follow-up.  Strict return precautions given understood.  Co morbidities that complicated the patient's evaluation  Reported cocaine use   Additional history obtained:  External records from outside sources obtained and reviewed including prior ED visits and prior Inpatient records.   Imaging Studies ordered:  I ordered imaging studies including CT head I independently visualized and interpreted obtained imaging which showed no acute disease I agree with the radiologist interpretation.     Medicines ordered:  I ordered medication including Tylenol, Toradol for headache Reevaluation of the patient after these medicines  showed that the patient: improved   Problem List / ED Course:  Headache   Reevaluation:  After the interventions noted above, I reevaluated the patient and found that they have: improved   Social Determinants of Health:  Drug use   Disposition:  After consideration of the diagnostic results and the patients response to treatment, I feel that the patent would benefit from close outpatient follow-up.          Final Clinical Impression(s) / ED Diagnoses Final diagnoses:  Chronic nonintractable headache, unspecified headache type    Rx / DC Orders ED Discharge Orders     None         Wynetta Fines, MD 05/01/21 1248    Wynetta Fines, MD 05/01/21 [phone]

## 2021-05-01 NOTE — ED Notes (Signed)
Assumed pt care at this time

## 2021-05-01 NOTE — ED Triage Notes (Signed)
Pt. Stated, ive had headache for 2 weeks. I do cocaine and my last was yesterday morning.

## 2021-05-01 NOTE — Discharge Instructions (Signed)
Return for any problem.  ?

## 2021-12-20 IMAGING — CT CT HEAD W/O CM
3 of 4 series · 13 of 47 positions shown, 15 images · non-contrast
Comparison: None.

CLINICAL DATA: Dizziness

EXAM:
CT HEAD WITHOUT CONTRAST
TECHNIQUE: Contiguous axial images were obtained from the base of the skull
through the vertex without intravenous contrast.

[Series 3: head without · axial · non-contrast · 0.41mm/px · z∈[-190,-70]mm · 7 of 32 slices shown, 9 images]
[im 4/32  brain]
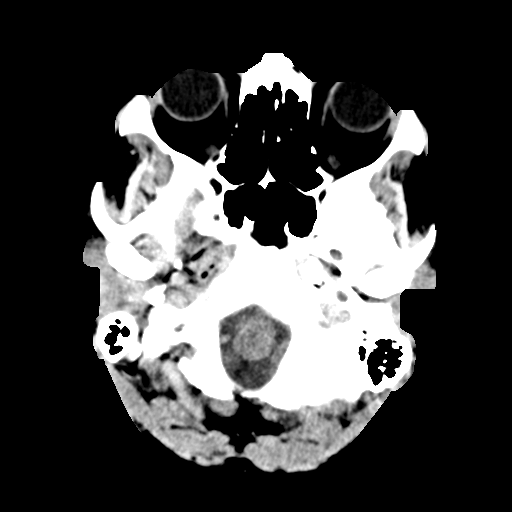
[im 4/32  bone]
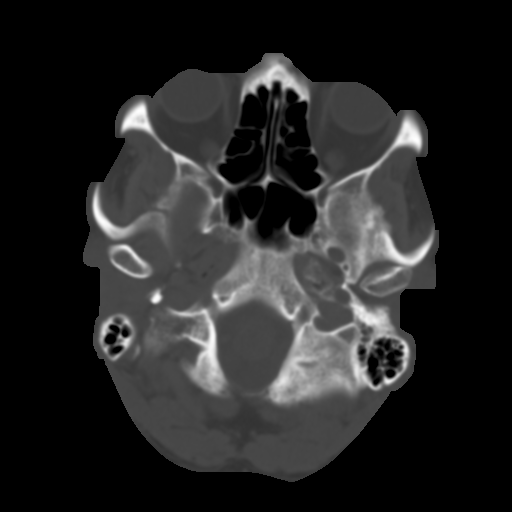
[im 8/32  brain]
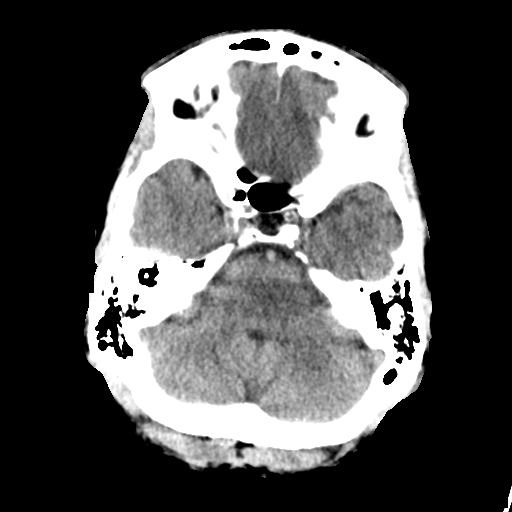
[im 12/32  brain]
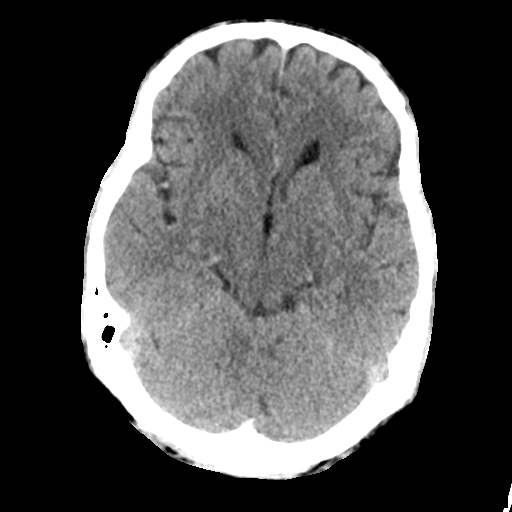
[im 16/32  brain]
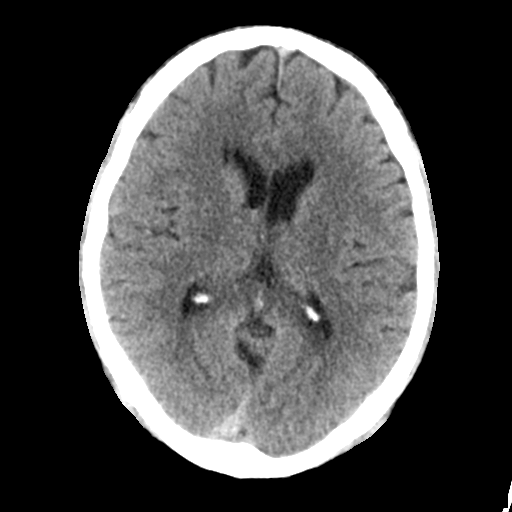
[im 20/32  brain]
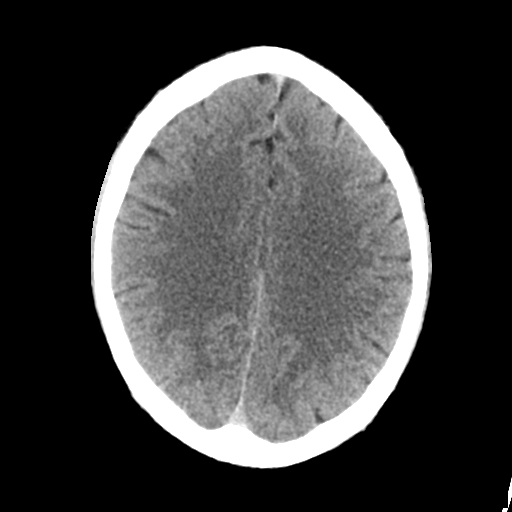
[im 20/32  bone]
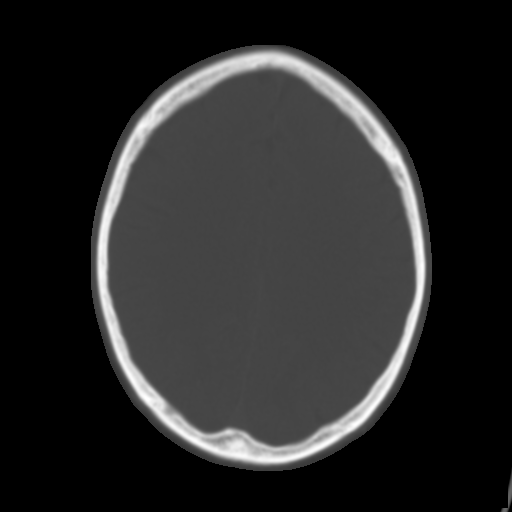
[im 24/32  brain]
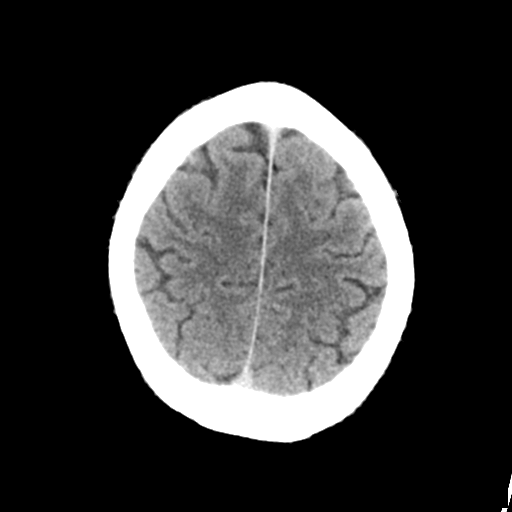
[im 28/32  brain]
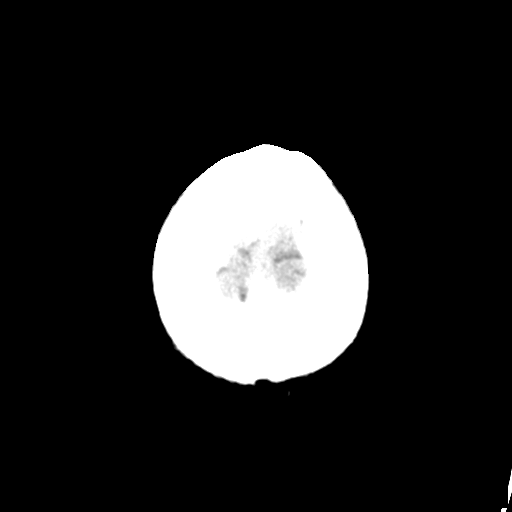

[Series 5: head without cor · coronal · non-contrast · 0.31mm/px · 3 of 67 slices shown]
[im 23/67  brain]
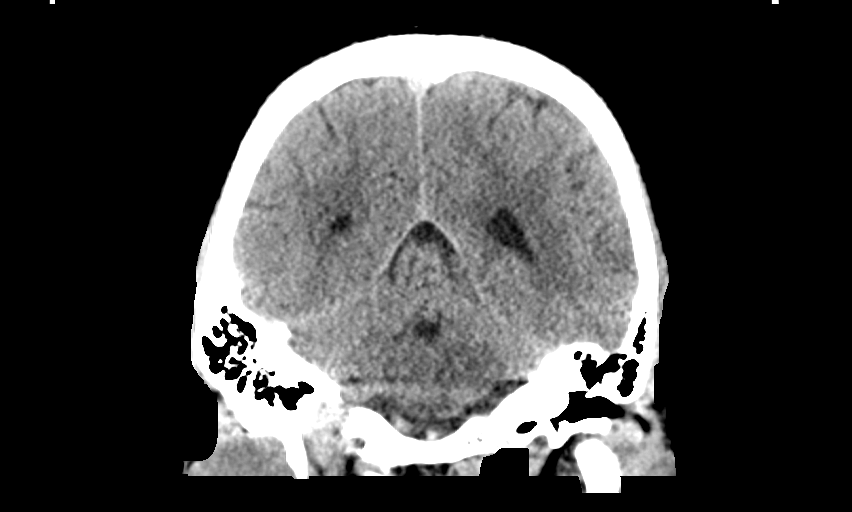
[im 30/67  brain]
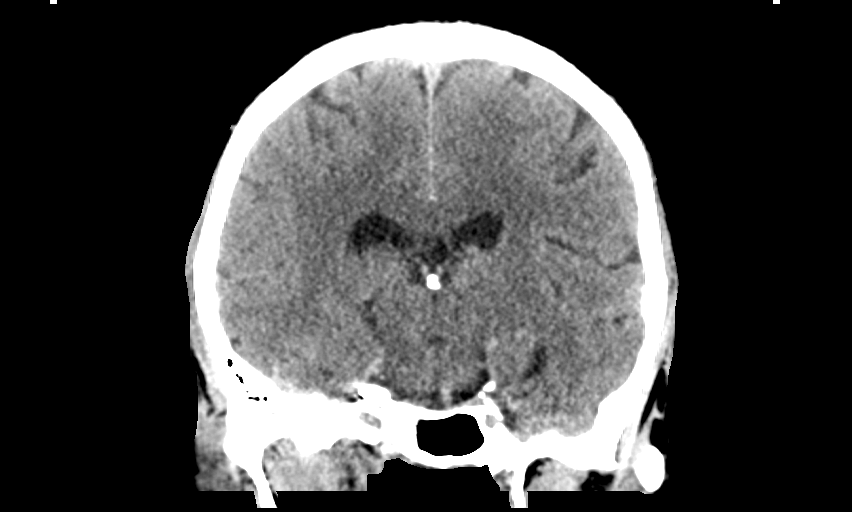
[im 37/67  brain]
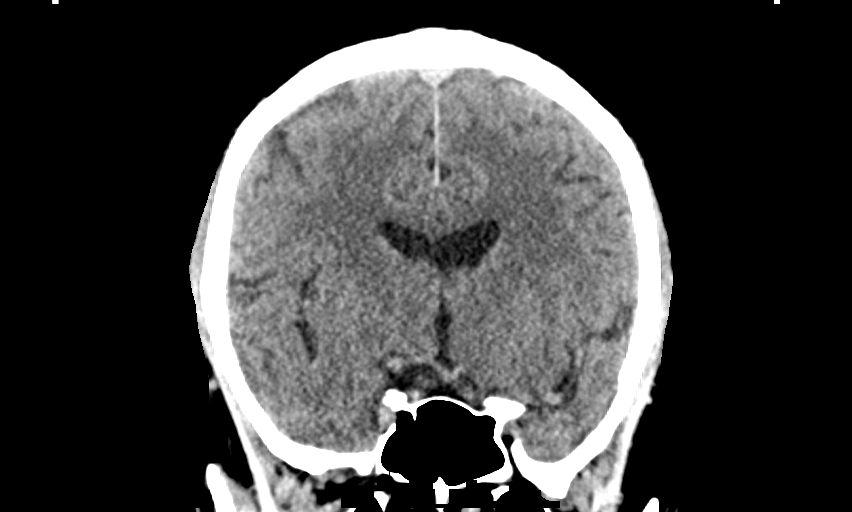

[Series 6: head without sag · sagittal · non-contrast · 0.31mm/px · 3 of 67 slices shown]
[im 23/67  brain]
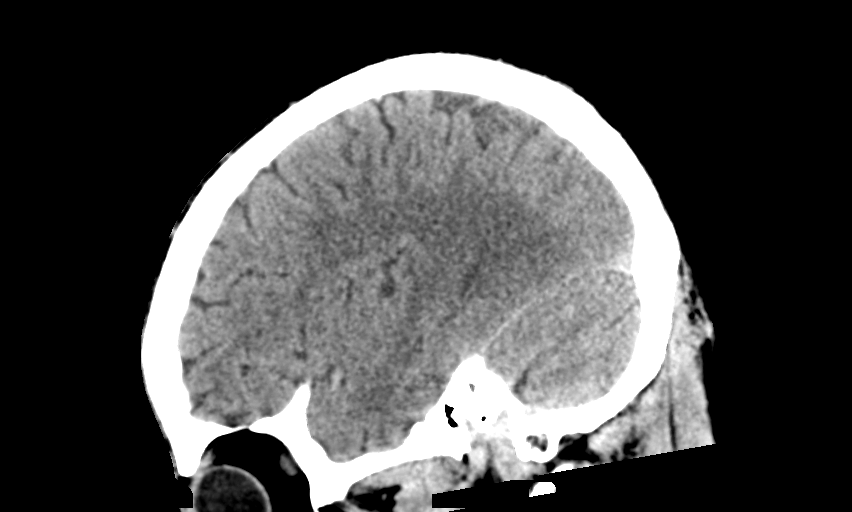
[im 34/67  brain]
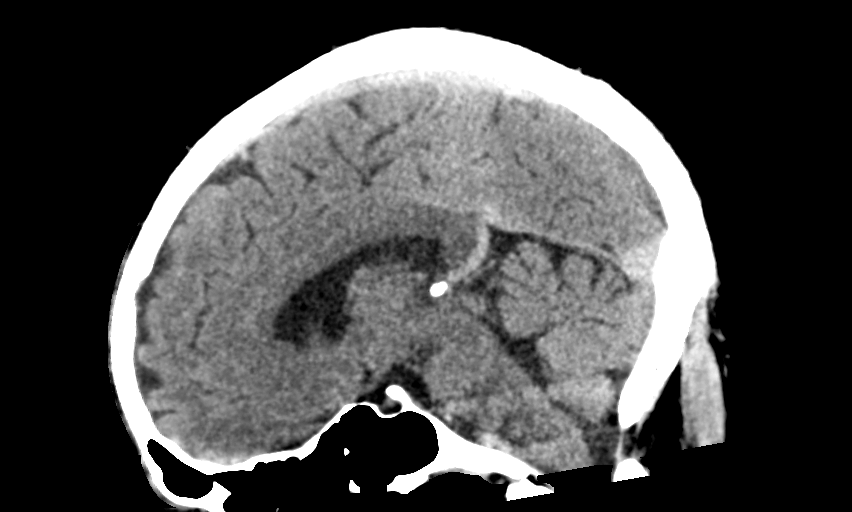
[im 45/67  brain]
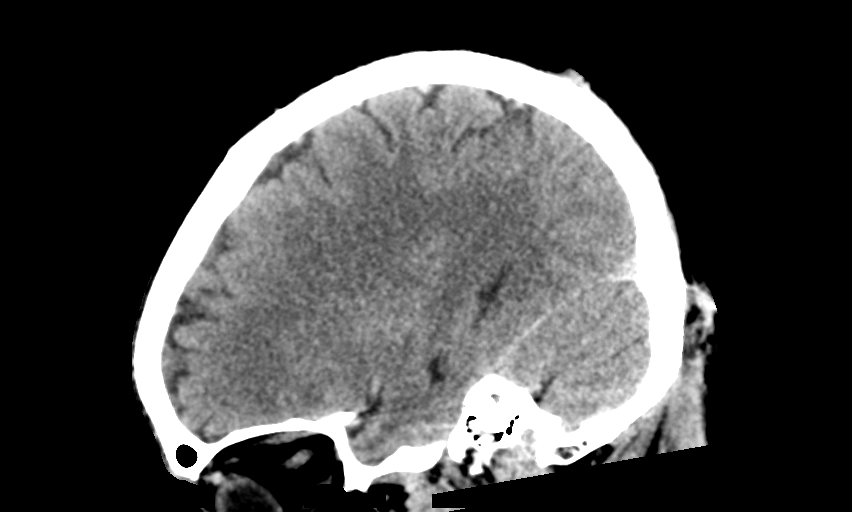

[13 of 47 positions shown; findings below may reference images not displayed]

FINDINGS: Brain: There is no mass, hemorrhage or extra-axial collection. The
size and configuration of the ventricles and extra-axial CSF spaces
are normal. The brain parenchyma is normal, without acute or chronic
infarction.

Vascular: No abnormal hyperdensity of the major intracranial
arteries or dural venous sinuses. No intracranial atherosclerosis.

Skull: The visualized skull base, calvarium and extracranial soft
tissues are normal.

Sinuses/Orbits: No fluid levels or advanced mucosal thickening of
the visualized paranasal sinuses. No mastoid or middle ear effusion.
The orbits are normal.
IMPRESSION: Normal head CT.

## 2022-01-01 ENCOUNTER — Encounter (HOSPITAL_COMMUNITY): Payer: Self-pay

## 2022-01-01 ENCOUNTER — Emergency Department (HOSPITAL_COMMUNITY): Payer: Self-pay

## 2022-01-01 ENCOUNTER — Emergency Department (HOSPITAL_COMMUNITY)
Admission: EM | Admit: 2022-01-01 | Discharge: 2022-01-01 | Disposition: A | Discharge status: Home / Self Care | Payer: Self-pay | Attending: Emergency Medicine | Admitting: Emergency Medicine

## 2022-01-01 ENCOUNTER — Other Ambulatory Visit: Payer: Self-pay

## 2022-01-01 DIAGNOSIS — R072 Precordial pain: Secondary | ICD-10-CM | POA: Insufficient documentation

## 2022-01-01 DIAGNOSIS — I10 Essential (primary) hypertension: Secondary | ICD-10-CM | POA: Insufficient documentation

## 2022-01-01 DIAGNOSIS — Z79899 Other long term (current) drug therapy: Secondary | ICD-10-CM | POA: Insufficient documentation

## 2022-01-01 DIAGNOSIS — F17200 Nicotine dependence, unspecified, uncomplicated: Secondary | ICD-10-CM | POA: Insufficient documentation

## 2022-01-01 LAB — COMPREHENSIVE METABOLIC PANEL
ALT: 28 U/L (ref 0–44)
AST: 47 U/L — ABNORMAL HIGH (ref 15–41)
Albumin: 3.8 g/dL (ref 3.5–5.0)
Alkaline Phosphatase: 44 U/L (ref 38–126)
Anion gap: 9 (ref 5–15)
BUN: 10 mg/dL (ref 8–23)
CO2: 25 mmol/L (ref 22–32)
Calcium: 9.1 mg/dL (ref 8.9–10.3)
Chloride: 108 mmol/L (ref 98–111)
Creatinine, Ser: 1.1 mg/dL (ref 0.61–1.24)
GFR, Estimated: >60 mL/min (ref >60)
Glucose, Bld: 99 mg/dL (ref 70–99)
Potassium: 4 mmol/L (ref 3.5–5.1)
Sodium: 142 mmol/L (ref 135–145)
Total Bilirubin: 1 mg/dL (ref 0.3–1.2)
Total Protein: 6.6 g/dL (ref 6.5–8.1)

## 2022-01-01 LAB — CBC WITH DIFFERENTIAL/PLATELET
Abs Immature Granulocytes: 0.02 10*3/uL (ref 0.00–0.07)
Basophils Absolute: 0.1 10*3/uL (ref 0.0–0.1)
Basophils Relative: 1 %
Eosinophils Absolute: 0.1 10*3/uL (ref 0.0–0.5)
Eosinophils Relative: 2 %
HCT: 49.5 % (ref 39.0–52.0)
Hemoglobin: 17.5 g/dL — ABNORMAL HIGH (ref 13.0–17.0)
Immature Granulocytes: 0 %
Lymphocytes Relative: 46 %
Lymphs Abs: 2.7 10*3/uL (ref 0.7–4.0)
MCH: 30.7 pg (ref 26.0–34.0)
MCHC: 35.4 g/dL (ref 30.0–36.0)
MCV: 86.8 fL (ref 80.0–100.0)
Monocytes Absolute: 0.5 10*3/uL (ref 0.1–1.0)
Monocytes Relative: 9 %
Neutro Abs: 2.4 10*3/uL (ref 1.7–7.7)
Neutrophils Relative %: 42 %
Platelets: 326 10*3/uL (ref 150–400)
RBC: 5.7 MIL/uL (ref 4.22–5.81)
RDW: 14.4 % (ref 11.5–15.5)
WBC: 5.8 10*3/uL (ref 4.0–10.5)
nRBC: 0 % (ref 0.0–0.2)

## 2022-01-01 LAB — LIPASE, BLOOD: Lipase: 32 U/L (ref 11–51)

## 2022-01-01 LAB — TROPONIN I (HIGH SENSITIVITY)
Troponin I (High Sensitivity): 7 ng/L (ref <18)
Troponin I (High Sensitivity): 7 ng/L (ref <18)

## 2022-01-01 MED ORDER — ASPIRIN 325 MG PO TABS
325.0000 mg | ORAL_TABLET | Freq: Every day | ORAL | Status: DC
  Administered 2022-01-01: 325 mg via ORAL
  Filled 2022-01-01: qty 1

## 2022-01-01 NOTE — Discharge Instructions (Signed)
Recommend start taking a baby aspirin a day and make an appointment to follow-up with cardiology.  Return for any new or worse chest pain.

## 2022-01-01 NOTE — ED Provider Notes (Addendum)
[facility] EMERGENCY DEPARTMENT Provider Note   CSN: [id number] Arrival date & time: 01/01/22  1135     History  Chief Complaint  Patient presents with   Abdominal Pain   Chest Pain    Jon Scott is a 61 y.o. male.  With a complaint of intermittent chest pain now for several months.  Pain is substernal very sharp almost takes his breath away when it occurs.  Does not occur every day.  But occurs at least once a month.  Was occurring today.  Still has discomfort now when it usually just last seconds but I think he is what he is referring to is the severe pain just last seconds.  Nonradiating.  No nausea no vomiting no shortness of br no abdominal.  Past medical history significant for hypertension.  Patient is an occasional smoker.       Home Medications Prior to Admission medications   Medication Sig Start Date End Date Taking? Authorizing Provider  hydrochlorothiazide (HYDRODIURIL) 25 MG tablet Take 1 tablet (25 mg total) by mouth daily. 07/28/20   Gilda Crease, MD  ibuprofen (ADVIL,MOTRIN) 600 MG tablet Take 1 tablet (600 mg total) by mouth every 6 (six) hours as needed. 11/24/13   Arby Barrette, MD      Allergies    Patient has no known allergies.    Review of Systems   Review of Systems  Constitutional:  Negative for chills and fever.  HENT:  Negative for ear pain and sore throat.   Eyes:  Negative for pain and visual disturbance.  Respiratory:  Negative for cough and shortness of breath.   Cardiovascular:  Positive for chest pain. Negative for palpitations and leg swelling.  Gastrointestinal:  Negative for abdominal pain and vomiting.  Genitourinary:  Negative for dysuria and hematuria.  Musculoskeletal:  Negative for arthralgias and back pain.  Skin:  Negative for color change and rash.  Neurological:  Negative for seizures and syncope.  All other systems reviewed and are negative.   Physical Exam Updated Vital Signs BP (!)  148/86   Pulse 68   Temp 98 F (36.7 C) (Oral)   Resp 16   Ht 1.829 m (6')   Wt 77.1 kg   SpO2 99%   BMI 23.06 kg/m  Physical Exam Vitals and nursing note reviewed.  Constitutional:      General: He is not in acute distress.    Appearance: Normal appearance. He is well-developed.  HENT:     Head: Normocephalic and atraumatic.  Eyes:     Extraocular Movements: Extraocular movements intact.     Conjunctiva/sclera: Conjunctivae normal.     Pupils: Pupils are equal, round, and reactive to light.  Cardiovascular:     Rate and Rhythm: Normal rate and regular rhythm.     Heart sounds: No murmur heard. Pulmonary:     Effort: Pulmonary effort is normal. No respiratory distress.     Breath sounds: Normal breath sounds. No wheezing, rhonchi or rales.  Chest:     Chest wall: No tenderness.  Abdominal:     General: Abdomen is flat. Bowel sounds are normal.     Palpations: Abdomen is soft.     Tenderness: There is no abdominal tenderness.  Musculoskeletal:        General: No swelling.     Cervical back: Neck supple.     Right lower leg: No edema.     Left lower leg: No edema.  Skin:  General: Skin is warm and dry.     Capillary Refill: Capillary refill takes less than 2 seconds.  Neurological:     General: No focal deficit present.     Mental Status: He is alert and oriented to person, place, and time.  Psychiatric:        Mood and Affect: Mood normal.     ED Results / Procedures / Treatments   Labs (all labs ordered are listed, but only abnormal results are displayed) Labs Reviewed  COMPREHENSIVE METABOLIC PANEL - Abnormal; Notable for the following components:      Result Value   AST 47 (*)    All other components within normal limits  CBC WITH DIFFERENTIAL/PLATELET - Abnormal; Notable for the following components:   Hemoglobin 17.5 (*)    All other components within normal limits  LIPASE, BLOOD  TROPONIN I (HIGH SENSITIVITY)  TROPONIN I (HIGH SENSITIVITY)     EKG EKG Interpretation  Date/Time:  Friday January 01 2022 16:16:16 EDT Ventricular Rate:  68 PR Interval:  217 QRS Duration: 90 QT Interval:  394 QTC Calculation: 419 R Axis:   -60 Text Interpretation: Sinus rhythm Borderline prolonged PR interval LAD, consider left anterior fascicular block Abnormal T, consider ischemia, diffuse leads Confirmed by Vanetta Mulders [phone]) on 01/01/2022 4:22:58 PM  Radiology DG Abdomen Acute W/Chest  Result Date: 01/01/2022 CLINICAL DATA:  Chest pain, epigastric pain EXAM: DG ABDOMEN ACUTE WITH 1 VIEW CHEST COMPARISON:  Chest radiographs done on 11/24/2013 FINDINGS: Cardiac size is within normal limits. Lung fields are clear of any infiltrates or pulmonary edema. There is no pleural effusion or pneumothorax. There is no evidence of intestinal obstruction or pneumoperitoneum. Moderate amount of stool is seen in colon and rectum. No abnormal masses or calcifications are seen. IMPRESSION: There is no evidence of intestinal obstruction or pneumoperitoneum. No focal pulmonary infiltrates are seen. Electronically Signed   By: Ernie Avena M.D.   On: 01/01/2022 14:48    Procedures Procedures    Medications Ordered in ED Medications  aspirin tablet 325 mg (325 mg Oral Given 01/01/22 1618)    ED Course/ Medical Decision Making/ A&P                           Medical Decision Making  Patient brought in by EMS.  Now complaining of just chest pain.  Denies any abdominal pain.  Patient's symptoms pains been more constant today usually sharp and intermittent and only lasting seconds.  Initial troponin normal at 7.  Patient had an abdominal series and chest x-ray without any acute findings.  Lipase normal CBC normal hemoglobin 17.5 a little hemoconcentrated complete metabolic panel normal renal function normal.  Electrolytes  normal.  If patient's delta troponin is negative patient stable for discharge home.  And follow-up with cardiology for  additional work-up.  Oxygen saturations here 93%.  EKG did have some T wave inversions diffusely.  Compared to old.  But otherwise no significant change.  We will reassess him once the second troponin is back.  Patient states that chest pain has resolved.  We will have him start a baby aspirin a day.  Patient's troponins are steady at 7 no significant change.  Patient will follow-up with cardiology.  For consideration for echocardiogram nonexercise stress test.  Patient will return for any new or worse symptoms.  Patient states he does not need a work note.   Final Clinical Impression(s) / ED Diagnoses Final  diagnoses:  Precordial pain    Rx / DC Orders ED Discharge Orders     None         Fredia Sorrow, MD 01/01/22 1638    Fredia Sorrow, MD 01/01/22 1858

## 2022-01-01 NOTE — ED Triage Notes (Addendum)
Pt now telling this RN that his pain is in the chest x 1 year. Pt reports some ShOB as well. Pt admits to cocaine last night which is when the pain started. EKG being performed at this time.

## 2022-01-01 NOTE — ED Triage Notes (Signed)
Pt BIB [facility] from a business. Pt presents with midline abd pain x 2 years that was intermittent. This AM pt was having increased pain and wanting to be evaluated.   RR 18 HR 102 148/98 SpO2 98 CBG 164

## 2022-01-01 NOTE — ED Provider Triage Note (Signed)
Emergency Medicine Provider Triage Evaluation Note  Jon Scott , a 61 y.o. male  was evaluated in triage.  Pt complains of chest and abdominal pain pain is been going on for about a years time, states he is here because its worse, pain is mainly in his epigastric region does not radiate, pain is worsened after p.o. intake, denies nausea or vomiting still passing gas having normal bowel movements, he denies any pleuritic chest pain, does not become diaphoretic, pain is not worsened with exertion, no cardiac history no history of PEs or DVTs currently on hormone therapy.  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   Review of Systems  Positive: Chest and abdominal pain Negative: Shortness of breath nausea vomiting  Physical Exam  BP 124/74   Pulse 90   Temp 98 F (36.7 C) (Oral)   Resp 18   Ht 6' (1.829 m)   Wt 77.1 kg   SpO2 93%   BMI 23.06 kg/m  Gen:   Awake, no distress   Resp:  Normal effort  MSK:   Moves extremities without difficulty  Other:    Medical Decision Making  Medically screening exam initiated at 1:10 PM.  Appropriate orders placed.  Jon Scott was informed that the remainder of the evaluation will be completed by another provider, this initial triage assessment does not replace that evaluation, and the importance of remaining in the ED until their evaluation is complete.  Lab work imaging ordered will need further work-up.   Marcello Fennel, PA-C 01/01/22 1311
